# Patient Record
Sex: Male | Born: 1965 | Race: White | Hispanic: No | Marital: Single | Smoking: Former smoker
Health system: Southern US, Community
[De-identification: ages and names within clinical notes are randomized; demographics above are authoritative.]

## PROBLEM LIST (undated history)

## (undated) DIAGNOSIS — E785 Hyperlipidemia, unspecified: Secondary | ICD-10-CM

## (undated) DIAGNOSIS — I10 Essential (primary) hypertension: Secondary | ICD-10-CM

## (undated) HISTORY — PX: NECK SURGERY: SHX720

## (undated) HISTORY — DX: Hyperlipidemia, unspecified: E78.5

## (undated) HISTORY — DX: Essential (primary) hypertension: I10

---

## 2010-01-26 ENCOUNTER — Ambulatory Visit: Payer: Self-pay | Admitting: Diagnostic Radiology

## 2010-01-26 ENCOUNTER — Emergency Department (HOSPITAL_BASED_OUTPATIENT_CLINIC_OR_DEPARTMENT_OTHER): Admission: EM | Admit: 2010-01-26 | Discharge: 2010-01-27 | Payer: Self-pay | Admitting: Emergency Medicine

## 2010-08-26 LAB — URINALYSIS, ROUTINE W REFLEX MICROSCOPIC
Ketones, ur: NEGATIVE mg/dL
Leukocytes, UA: NEGATIVE
Protein, ur: NEGATIVE mg/dL
Specific Gravity, Urine: 1.02 (ref 1.005–1.030)
Urobilinogen, UA: 0.2 mg/dL (ref 0.0–1.0)

## 2010-08-26 LAB — CBC
MCH: 30.1 pg (ref 26.0–34.0)
MCHC: 34.2 g/dL (ref 30.0–36.0)
Platelets: 421 10*3/uL — ABNORMAL HIGH (ref 150–400)
RBC: 5.59 MIL/uL (ref 4.22–5.81)
RDW: 12.1 % (ref 11.5–15.5)
WBC: 14.4 10*3/uL — ABNORMAL HIGH (ref 4.0–10.5)

## 2010-08-26 LAB — POCT TOXICOLOGY PANEL: TCA Scrn: POSITIVE

## 2010-08-26 LAB — URINE MICROSCOPIC-ADD ON

## 2010-08-26 LAB — BASIC METABOLIC PANEL
CO2: 27 mEq/L (ref 19–32)
Chloride: 101 mEq/L (ref 96–112)
GFR calc Af Amer: >60 mL/min (ref >60)
GFR calc non Af Amer: >60 mL/min (ref >60)
Potassium: 4.2 mEq/L (ref 3.5–5.1)
Sodium: 143 mEq/L (ref 135–145)

## 2010-08-26 LAB — DIFFERENTIAL
Basophils Relative: 1 % (ref 0–1)
Monocytes Absolute: 1 10*3/uL (ref 0.1–1.0)
Monocytes Relative: 7 % (ref 3–12)
Neutrophils Relative %: 73 % (ref 43–77)

## 2016-07-27 ENCOUNTER — Ambulatory Visit (INDEPENDENT_AMBULATORY_CARE_PROVIDER_SITE_OTHER): Payer: 59 | Admitting: Neurology

## 2016-07-27 ENCOUNTER — Encounter: Payer: Self-pay | Admitting: Neurology

## 2016-07-27 VITALS — BP 124/72 | HR 75 | Ht 72.0 in | Wt 223.7 lb

## 2016-07-27 DIAGNOSIS — R32 Unspecified urinary incontinence: Secondary | ICD-10-CM | POA: Diagnosis not present

## 2016-07-27 DIAGNOSIS — R200 Anesthesia of skin: Secondary | ICD-10-CM | POA: Diagnosis not present

## 2016-07-27 DIAGNOSIS — R519 Headache, unspecified: Secondary | ICD-10-CM

## 2016-07-27 DIAGNOSIS — M542 Cervicalgia: Secondary | ICD-10-CM

## 2016-07-27 DIAGNOSIS — R51 Headache: Secondary | ICD-10-CM | POA: Diagnosis not present

## 2016-07-27 MED ORDER — TIZANIDINE HCL 2 MG PO TABS: 2.0000 mg | ORAL_TABLET | Freq: Four times a day (QID) | ORAL | 0 refills | Status: DC | PRN

## 2016-07-27 MED ORDER — TRAMADOL HCL 50 MG PO TABS: 50.0000 mg | ORAL_TABLET | Freq: Four times a day (QID) | ORAL | 1 refills | Status: DC | PRN

## 2016-07-27 MED ORDER — PREDNISONE 10 MG PO TABS: ORAL_TABLET | ORAL | 0 refills | Status: DC

## 2016-07-27 MED ORDER — GABAPENTIN 300 MG PO CAPS: 300.0000 mg | ORAL_CAPSULE | Freq: Two times a day (BID) | ORAL | 2 refills | Status: DC

## 2016-07-27 NOTE — Patient Instructions (Addendum)
1.  Stop butalbital and Aleve. 2.  To help break this daily headache, I will prescribe you a prednisone taper.  Take 6 tablets at once on day one, then 5 tablets on day two, then 4 tablets on day three, then 3 tablets on day four, then 2 tablets on day five, then 1 tablet on day six, then STOP.   3.  When you get a severe headache, take tramadol.  May take 1 pill up to every 6 hours as needed.  Limit use to no more than 2 days out of the week to prevent rebound headache.   4.  May take tizanidine for headache flare up as well (as directed).  Caution for drowsiness and driving if taken. 5.  We will check MRI of brain and cervical spine 6.  Start gabapentin 300mg  twice daily to help reduce frequency of headaches.  Contact me in 4 weeks and we can increase dose if needed. 7.  Follow up in 3 months but WE CAN INCREASE DOSE OF GABAPENTIN IN 4 WEEKS IF NEEDED, SO CONTACT ME THEN.

## 2016-07-27 NOTE — Progress Notes (Signed)
NEUROLOGY CONSULTATION NOTE  Cory Kerr MRN: [id number] DOB: December 25, 1965  Referring provider: Dr. Nehemiah Settle Primary care provider: Dr. Nehemiah Settle  Reason for consult:  headache  HISTORY OF PRESENT ILLNESS: Cory Kerr is a 51 year old male with hypertension, mixed hyperlipidemia, chronic back pain and history of renal calculus and tachycardia status post ablation in 2004 who presents for headache.  History supplemented by PCP note.  He has had headaches for several years, however they started to increase about 2 years ago and particularly gotten worse over the past 2 weeks.  He has a daily constant moderate 5-7/10 headache but has fluctuations of severe headaches.  They are mid-occipital and are non-radiating.  It is a 12/10 pounding pain, sometimes with tingling sensation on the back of his head.  There is some phonophobia but denies nausea, photophobia or visual disturbance.  They last several hours and occur about 5 days out of the month.  He notes some mild neck pain but does not radiate down the arms.  He had been taking ibuprofen daily for several years until last week, when his PCP told him to stop.  Since last week, he has been taking Aleve and Fioricet daily.  The Fioricet takes the edge off.  He denies any particular trigger.  They occur any time of day and he may wake up with them.  The other day, he was sitting at his desk at work when his left arm suddenly became numb for a few minutes and spontaneously resolved.  There was no pain or noticeable weakness.  It was not associated with headache.  He has history of increased urinary frequency, which requires him to wake up at night to go to the bathroom.  On two occasions, he woke up in bed with headache and had wet the bed. He denies back pain, saddle anesthesia, leg weakness or erectile dysfunction.  He drinks 1 cup of coffee daily He denies any depression or anxiety He denies family history of headache or intracranial aneurysm.  A  Head CT from 01/26/10 was personally reviewed and was unremarkable.  PAST MEDICAL HISTORY: Hypertension Hyperlipidemia  PAST SURGICAL HISTORY: No past surgical history on file.  MEDICATIONS: Lisinopril-HCTZ Claritin Fioricet Aleve  ALLERGIES: Allergies  Allergen Reactions  . Nitroglycerin Other (See Comments)    Sweating, wife stated he was "flopping like a fish". He stated he doesn't remember the incident at all.     FAMILY HISTORY: No family history on file. .  SOCIAL HISTORY: Social History   Social History  . Marital status: Single    Spouse name: N/A  . Number of children: N/A  . Years of education: N/A   Occupational History  . Not on file.   Social History Main Topics  . Smoking status: Former Smoker    Packs/day: 0.50    Years: 10.00    Types: Cigarettes    Quit date: 06/13/2003  . Smokeless tobacco: Current User  . Alcohol use Not on file  . Drug use: Unknown  . Sexual activity: Not on file   Other Topics Concern  . Not on file   Social History Narrative  . No narrative on file    REVIEW OF SYSTEMS: Constitutional: No fevers, chills, or sweats, no generalized fatigue, change in appetite Eyes: No visual changes, double vision, eye pain Ear, nose and throat: No hearing loss, ear pain, nasal congestion, sore throat Cardiovascular: No chest pain, palpitations Respiratory:  No shortness of breath at rest or with exertion,  wheezes GastrointestinaI: No nausea, vomiting, diarrhea, abdominal pain, fecal incontinence Genitourinary:  No dysuria, urinary retention or frequency Musculoskeletal:  No neck pain, back pain Integumentary: No rash, pruritus, skin lesions Neurological: as above Psychiatric: No depression, insomnia, anxiety Endocrine: No palpitations, fatigue, diaphoresis, mood swings, change in appetite, change in weight, increased thirst Hematologic/Lymphatic:  No purpura, petechiae. Allergic/Immunologic: no itchy/runny eyes, nasal congestion,  recent allergic reactions, rashes  PHYSICAL EXAM: Vitals:   07/27/16 1001  BP: 124/72  Pulse: 75   General: No acute distress.  Patient appears well-groomed.  Head:  Normocephalic/atraumatic Eyes:  fundi examined but not visualized Neck: supple, bilateral suboccipital tenderness, paraspinal tenderness at C7, full range of motion Back: No paraspinal tenderness Heart: regular rate and rhythm Lungs: Clear to auscultation bilaterally. Vascular: No carotid bruits. Neurological Exam: Mental status: alert and oriented to person, place, and time, recent and remote memory intact, fund of knowledge intact, attention and concentration intact, speech fluent and not dysarthric, language intact. Cranial nerves: CN I: not tested CN II: pupils equal, round and reactive to light, visual fields intact CN III, IV, VI:  full range of motion, no nystagmus, no ptosis CN V: facial sensation intact CN VII: upper and lower face symmetric CN VIII: hearing intact CN IX, X: gag intact, uvula midline CN XI: sternocleidomastoid and trapezius muscles intact CN XII: tongue midline Bulk & Tone: normal, no fasciculations. Motor:  5/5 throughout  Sensation:  Pinprick and vibration sensation intact. Deep Tendon Reflexes:  2+ throughout, although slightly more brisk in the knees, toes downgoing.  Finger to nose testing:  Without dysmetria.  Heel to shin:  Without dysmetria.  Gait:  Normal station and stride.  Able to turn and tandem walk. Romberg negative.  IMPRESSION: 1.  Occipital headaches 2.  Transient left arm numbness 3.  Urinary incontinence.  4.  Neck pain.  Headaches sound like cervicogenic headaches with possible component of occipital neuralgia.  He does have some neck pain and suboccipital tenderness.  However, further testing is necessary given his other symptoms.   PLAN: 1.  We will check MRI of brain and cervical spine to evaluate for cause of headache, transient left arm weakness and urinary  incontinence.  Further recommendations pending results.  2.  To help break this daily headache, I will prescribe  a prednisone taper.  3.  Start gabapentin 300mg  twice daily as preventative for headache. 4.  Stop Fioricet and Aleve.  5.  For abortive therapy, will prescribe tramadol and tizanidine, limited to no more than 2 days out of the week to prevent rebound headache. 6.  Follow up in 3 months but contact me in 4 weeks with update and we can adjust dose of gabapentin if needed. 7.  Discuss two episodes of urinary incontinence with Dr. Nehemiah SettlePolite.  Thank you for allowing me to take part in the care of this patient.  Shon MilletAdam Danisha Brassfield, DO  CC:  Renford Dillsonald Polite, MD

## 2016-08-03 ENCOUNTER — Telehealth: Payer: Self-pay | Admitting: Neurology

## 2016-08-03 NOTE — Telephone Encounter (Signed)
Called patient. Provided GI phone number.

## 2016-08-03 NOTE — Telephone Encounter (Signed)
Cory PandaWalter Kerr Aug 12, 2065. His # [phone] [id number]. He was calling regarding his MRI. He was needing to know if he will be contacted to set it up? Thank you

## 2016-08-09 ENCOUNTER — Ambulatory Visit
Admission: RE | Admit: 2016-08-09 | Discharge: 2016-08-09 | Disposition: A | Discharge status: Home / Self Care | Payer: 59 | Source: Ambulatory Visit | Attending: Neurology | Admitting: Neurology

## 2016-08-09 DIAGNOSIS — R51 Headache: Principal | ICD-10-CM

## 2016-08-09 DIAGNOSIS — M542 Cervicalgia: Secondary | ICD-10-CM

## 2016-08-09 DIAGNOSIS — R519 Headache, unspecified: Secondary | ICD-10-CM

## 2016-08-09 DIAGNOSIS — R32 Unspecified urinary incontinence: Secondary | ICD-10-CM

## 2016-08-09 DIAGNOSIS — R200 Anesthesia of skin: Secondary | ICD-10-CM

## 2016-08-10 ENCOUNTER — Telehealth: Payer: Self-pay | Admitting: Neurology

## 2016-08-10 NOTE — Telephone Encounter (Signed)
Patient made aware of results and would like referral to Neurosurgery.  Referral faxed to [facility] at [phone] with confirmation received. They will contact the patient to schedule.

## 2016-08-10 NOTE — Telephone Encounter (Signed)
-----   Message from Drema DallasAdam R Jaffe, DO sent at 08/10/2016 12:19 PM EST ----- MRI of brain looks okay.  MRI of cervical spine shows disc bulge that is slightly pressing on the spinal cord.  I can't say that this is causing the urinary incontinence but it definitely is contributing to the headache.  If he is open, we can send him to neurosurgery for consultation to see if he may benefit from surgery.

## 2016-09-22 ENCOUNTER — Ambulatory Visit: Payer: Self-pay | Admitting: Neurology

## 2016-10-26 ENCOUNTER — Ambulatory Visit: Payer: 59 | Admitting: Neurology

## 2016-10-26 DIAGNOSIS — Z029 Encounter for administrative examinations, unspecified: Secondary | ICD-10-CM

## 2016-10-27 ENCOUNTER — Encounter: Payer: Self-pay | Admitting: Neurology

## 2016-12-08 ENCOUNTER — Encounter: Payer: Self-pay | Admitting: Neurology

## 2016-12-08 ENCOUNTER — Ambulatory Visit (INDEPENDENT_AMBULATORY_CARE_PROVIDER_SITE_OTHER): Payer: 59 | Admitting: Neurology

## 2016-12-08 VITALS — BP 118/82 | HR 79 | Ht 72.0 in | Wt 225.4 lb

## 2016-12-08 DIAGNOSIS — R51 Headache: Secondary | ICD-10-CM

## 2016-12-08 DIAGNOSIS — R519 Headache, unspecified: Secondary | ICD-10-CM

## 2016-12-08 DIAGNOSIS — M542 Cervicalgia: Secondary | ICD-10-CM

## 2016-12-08 MED ORDER — GABAPENTIN 300 MG PO CAPS: ORAL_CAPSULE | ORAL | 0 refills | Status: DC

## 2016-12-08 MED ORDER — TIZANIDINE HCL 2 MG PO TABS: 2.0000 mg | ORAL_TABLET | Freq: Three times a day (TID) | ORAL | 0 refills | Status: DC

## 2016-12-08 MED ORDER — PREDNISONE 10 MG PO TABS: ORAL_TABLET | ORAL | 0 refills | Status: DC

## 2016-12-08 NOTE — Patient Instructions (Signed)
1.  Restart a prednisone taper.  Take 6tabs x1day, then 5tabs x1day, then 4tabs x1day, then 3tabs x1day, then 2tabs x1day, then 1tab x1day, then STOP 2.  Restart gabapentin 300mg , 1 capsule twice daily, in 7 days, increase to 2 capsules twice daily. 3.  Restart tizanidine 2mg .  Take three times daily if tolerated. 4.  Contact me in 3 weeks with update. 5.  Follow up

## 2016-12-08 NOTE — Progress Notes (Signed)
NEUROLOGY FOLLOW UP OFFICE NOTE  Cory Kerr [id number]  HISTORY OF PRESENT ILLNESS: Cory Kerr is a 51 year old male with hypertension, mixed hyperlipidemia, chronic back pain and history of renal calculus and tachycardia status post ablation in 2004 who follows up for occipital headaches.  UPDATE: MRI of brain from 08/09/16 was personally reviewed and revealed a partial empty sella but otherwise unremarkable.  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  Due to metallic foreign body in patient's chin, there is partial obscuration of the mid cervical spine.  He had noted some mild left hand weakness.  He was referred to neurosurgery for evaluation.  He underwent ACDF in March and the headaches had improved for a while until a week and a half ago.  He reports a constant intractable headache, fluctuating from 6 to 10/10.  He denies any particular trigger.  He started working out again a month ago and denies heavy lifting.  Nothing makes it better.  Ibuprofen is ineffective.  It is mid-suboccipital/upper cervical region.  Repeat cervical scan reportedly revealed nothing structural that would be causing the headache.  Prior to surgery, he took gabapentin 300mg  twice daily for about a month.  It was ineffective.  Tizanidine 2mg  and tramadol as needed were ineffective.   HISTORY: He has had headaches for several years, however they started to increase in 2016 and particularly gotten worse in early 2018.  At the time, he described a daily constant moderate 5-7/10 headache but has fluctuations of severe headaches.  They are mid-occipital and are non-radiating.  It is a 12/10 pounding pain, sometimes with tingling sensation on the back of his head.  There is some phonophobia but denies nausea, photophobia or visual disturbance.  They last several hours and occur about 5 days out  of the month.  He notes some mild neck pain but does not radiate down the arms.  He had been taking ibuprofen daily for several years until last week, when his PCP told him to stop.  Since last week, he has been taking Aleve and Fioricet daily.  The Fioricet takes the edge off.  He denies any particular trigger.  They occur any time of day and he may wake up with them.  The other day, he was sitting at his desk at work when his left arm suddenly became numb for a few minutes and spontaneously resolved.  There was no pain or noticeable weakness.  It was not associated with headache.  He has history of increased urinary frequency, which requires him to wake up at night to go to the bathroom.  On two occasions, he woke up in bed with headache and had wet the bed. He denies back pain, saddle anesthesia, leg weakness or erectile dysfunction.   He drinks 1 cup of coffee daily He denies any depression or anxiety He denies family history of headache or intracranial aneurysm.  PAST MEDICAL HISTORY: Past Medical History:  Diagnosis Date  . Hyperlipemia   . Hypertension     MEDICATIONS: No current outpatient prescriptions on file prior to visit.   No current facility-administered medications on file prior to visit.     ALLERGIES: Allergies  Allergen Reactions  . Nitroglycerin Other (See Comments)    Sweating, wife stated he was "flopping like a fish". He stated he doesn't remember the incident at all.     FAMILY HISTORY: Family  History  Problem Relation Age of Onset  . Heart disease Mother   . Heart disease Father     SOCIAL HISTORY: Social History   Social History  . Marital status: Single    Spouse name: N/A  . Number of children: N/A  . Years of education: N/A   Occupational History  . Not on file.   Social History Main Topics  . Smoking status: Former Smoker    Packs/day: 0.50    Years: 10.00    Types: Cigarettes    Quit date: 06/13/2003  . Smokeless tobacco: Current User    . Alcohol use Not on file  . Drug use: Unknown  . Sexual activity: Not on file   Other Topics Concern  . Not on file   Social History Narrative  . No narrative on file    REVIEW OF SYSTEMS: Constitutional: No fevers, chills, or sweats, no generalized fatigue, change in appetite Eyes: No visual changes, double vision, eye pain Ear, nose and throat: No hearing loss, ear pain, nasal congestion, sore throat Cardiovascular: No chest pain, palpitations Respiratory:  No shortness of breath at rest or with exertion, wheezes GastrointestinaI: No nausea, vomiting, diarrhea, abdominal pain, fecal incontinence Genitourinary:  No dysuria, urinary retention or frequency Musculoskeletal:  Neck pain Integumentary: No rash, pruritus, skin lesions Neurological: as above Psychiatric: No depression, insomnia, anxiety Endocrine: No palpitations, fatigue, diaphoresis, mood swings, change in appetite, change in weight, increased thirst Hematologic/Lymphatic:  No purpura, petechiae. Allergic/Immunologic: no itchy/runny eyes, nasal congestion, recent allergic reactions, rashes  PHYSICAL EXAM: Vitals:   12/08/16 0856  BP: 118/82  Pulse: 79   General: No acute distress.  Patient appears well-groomed.  normal body habitus. Head:  Normocephalic/atraumatic Eyes:  Fundi examined but not visualized Neck: supple, left/mid suboccipital upper cervical paraspinal tenderness, full range of motion Heart:  Regular rate and rhythm Lungs:  Clear to auscultation bilaterally Back: No paraspinal tenderness Neurological Exam: alert and oriented to person, place, and time. Attention span and concentration intact, recent and remote memory intact, fund of knowledge intact.  Speech fluent and not dysarthric, language intact.  CN II-XII intact. Bulk and tone normal, muscle strength 5/5 throughout.  Sensation to light touch, temperature and vibration intact.  Deep tendon reflexes 2+ throughout, toes downgoing.  Finger to  nose and heel to shin testing intact.  Gait normal, Romberg negative.  IMPRESSION: Suboccipital/upper cervical neck pain, intractable  PLAN: 1.  Will prescribe prednisone taper 2.  Restart gabapentin titrating higher to 600mg  twice daily 3.  Restart tizanidine 2mg , but will have him take three times daily around the clock (or as tolerated) 4.  He is to contact me in 2-3 weeks  Shon MilletAdam Victorious Kundinger, DO  CC:  Renford Dillsonald Polite, MD

## 2016-12-26 ENCOUNTER — Telehealth: Payer: Self-pay | Admitting: Neurology

## 2016-12-26 NOTE — Telephone Encounter (Signed)
Caller: Cory Kerr   Urgent? No  Reason for the call: He has been taking a muscle relaxer as well as a pain killer. He said he is still having headaches and they are lasting for days. He said he feels like a "Zombie" with the medication, but that it does take the edge off some and helps him sleep. Please call. Thanks

## 2016-12-27 ENCOUNTER — Other Ambulatory Visit: Payer: Self-pay | Admitting: *Deleted

## 2016-12-27 MED ORDER — NORTRIPTYLINE HCL 25 MG PO CAPS: 25.0000 mg | ORAL_CAPSULE | Freq: Every day | ORAL | 3 refills | Status: DC

## 2016-12-27 NOTE — Telephone Encounter (Signed)
Left message for patient to call me back. 

## 2016-12-27 NOTE — Telephone Encounter (Signed)
It looks like he is taking gabapentin and tizanidine.  Please advise.

## 2016-12-27 NOTE — Telephone Encounter (Signed)
1.  I don't want to increase the gabapentin if it is already making him drowsy.  I would have him taper off of it (300mg  twice daily for 1 week and then stop). 2.  Instead, we can start nortriptyline 25mg  at bedtime. 3.  He should continue tizanidine 2mg .  He should continue taking it 3 times daily but if he feels this may be causing drowsiness, then he can take it up to three times daily as needed. 4.  Contact us in 4 weeks with update.

## 2016-12-27 NOTE — Telephone Encounter (Signed)
Patient given instructions and agreed with plan.  Rx sent in for nortriptyline.

## 2017-01-22 ENCOUNTER — Telehealth: Payer: Self-pay | Admitting: Neurology

## 2017-01-22 NOTE — Telephone Encounter (Signed)
Recommend increasing nortriptyline to 50mg  at bedtime and see if this helps, as he is on a low dose of this.

## 2017-01-22 NOTE — Telephone Encounter (Signed)
Please advise if another medication would be appropriate.

## 2017-01-22 NOTE — Telephone Encounter (Signed)
Patient called needing to see if he can have something stronger for his headaches. He said the medications that Dr. Everlena CooperJaffe put him on are not touching the headaches. Please Advise. Thanks

## 2017-01-23 MED ORDER — FLURBIPROFEN 50 MG PO TABS: 50.0000 mg | ORAL_TABLET | Freq: Two times a day (BID) | ORAL | 0 refills | Status: DC | PRN

## 2017-01-23 MED ORDER — TIZANIDINE HCL 2 MG PO TABS
2.0000 mg | ORAL_TABLET | Freq: Three times a day (TID) | ORAL | 0 refills | Status: DC
Start: 2017-01-23 — End: 2017-03-11

## 2017-01-23 NOTE — Telephone Encounter (Signed)
For his acute pain, start Ansaid 50mg  twice daily as needed (rx sent).  Continue nortriptyline 50mg  at bedtime and tizanidine 2mg  TID.

## 2017-01-23 NOTE — Telephone Encounter (Signed)
PT called and said the medication is not helping for the headaches and is asking if someone can call him in a prescription for something to help him, he says the headaches are so bad they bring him to his knees

## 2017-01-23 NOTE — Telephone Encounter (Signed)
Spoke w/Pt. advsd to increase nortriptyline from 25 mg to 50 mg. Pt states nothing is helping, he can't take the pain of these headaches, asks that we please call in something that will stop the pain. Asked Pt if still taking Tizanidine 2 mg TID, he stated he is but will run out tomorrow or next day, rqsts refill to [facility] [place].Sent Rx. Pt also needs f/u, will advise again on return call.

## 2017-01-23 NOTE — Telephone Encounter (Signed)
Advsd Pt. Of Ansaid Rx sent in, also to continue nortiptyline at 50 mg QHS and Tizanidine 2 mg TID. Pt will call back to make f/u appt.

## 2017-01-24 ENCOUNTER — Other Ambulatory Visit: Payer: Self-pay

## 2017-01-24 ENCOUNTER — Telehealth: Payer: Self-pay

## 2017-01-24 NOTE — Telephone Encounter (Signed)
Pt called office, said he want to p/u Rxs I told him I would send in, there was only tizanidine. I assured him I also sent Ansaid. Called pharmacy, spoke w/pharmacist Tammy SoursGreg. He said both Rxs were rcvd yesterday, Ansaid had to be ordered, but is ready for P/u. Called Pt and advsd.

## 2017-02-15 ENCOUNTER — Other Ambulatory Visit: Payer: Self-pay | Admitting: Neurology

## 2017-02-15 NOTE — Telephone Encounter (Signed)
He got #[phone] filled 3 weeks ago.  Please advise.

## 2017-02-15 NOTE — Telephone Encounter (Signed)
Dr. Everlena CooperJaffe patient, will route to him.

## 2017-03-11 ENCOUNTER — Other Ambulatory Visit: Payer: Self-pay | Admitting: Neurology

## 2017-03-14 ENCOUNTER — Encounter: Payer: Self-pay | Admitting: Neurology

## 2017-03-14 ENCOUNTER — Ambulatory Visit (INDEPENDENT_AMBULATORY_CARE_PROVIDER_SITE_OTHER): Payer: 59 | Admitting: Neurology

## 2017-03-14 VITALS — BP 104/82 | HR 78 | Ht 72.0 in | Wt 217.6 lb

## 2017-03-14 DIAGNOSIS — M5481 Occipital neuralgia: Secondary | ICD-10-CM | POA: Diagnosis not present

## 2017-03-14 DIAGNOSIS — G4486 Cervicogenic headache: Secondary | ICD-10-CM

## 2017-03-14 DIAGNOSIS — R51 Headache: Secondary | ICD-10-CM | POA: Diagnosis not present

## 2017-03-14 DIAGNOSIS — R519 Headache, unspecified: Secondary | ICD-10-CM

## 2017-03-14 MED ORDER — NORTRIPTYLINE HCL 50 MG PO CAPS: 100.0000 mg | ORAL_CAPSULE | Freq: Every day | ORAL | 2 refills | Status: DC

## 2017-03-14 MED ORDER — TIZANIDINE HCL 4 MG PO TABS: 4.0000 mg | ORAL_TABLET | Freq: Three times a day (TID) | ORAL | 2 refills | Status: DC

## 2017-03-14 MED ORDER — NAPROXEN 500 MG PO TABS: 500.0000 mg | ORAL_TABLET | Freq: Two times a day (BID) | ORAL | 2 refills | Status: DC | PRN

## 2017-03-14 NOTE — Progress Notes (Signed)
NEUROLOGY FOLLOW UP OFFICE NOTE  Cory Kerr [id number]  HISTORY OF PRESENT ILLNESS: Cory Kerr is a 51 year old male with hypertension, mixed hyperlipidemia, chronic back pain and history of renal calculus and tachycardia status post ablation in 2004 who follows up for occipital headaches.   UPDATE: Prednisone taper did not abort the persistent headache but helped for a couple of days.  He was restarted on gabapentin and tizanidine  three times daily.  Gabapentin caused drowsiness, so it was changed to nortriptyline.  He is currently taking nortriptyline  at bedtime and tizanidine  three times daily.  Headaches are still severe.  They last 3 days and occur every 2 to 3 days.  No specific triggers.  Nothing relieves it.   Understandably, he is very frustrated.   HISTORY: He has had headaches for several years, however they started to increase in 2016 and particularly gotten worse in early 2018.  At the time, he described a daily constant moderate 5-7/10 headache but has fluctuations of severe headaches.  They are mid-occipital and are non-radiating.  It is a 12/10 pounding pain, sometimes with tingling sensation on the back of his head.  There is some phonophobia but denies nausea, photophobia or visual disturbance.  They last several hours and occur about 5 days out of the month.  He notes some mild neck pain but does not radiate down the arms.  He had been taking ibuprofen daily for several years until last week, when his PCP told him to stop.  Since last week, he has been taking Aleve and Fioricet daily.  The Fioricet takes the edge off.  He denies any particular trigger.  They occur any time of day and he may wake up with them.  The other day, he was sitting at his desk at work when his left arm suddenly became numb for a few minutes and spontaneously resolved.  There was no pain or noticeable weakness.  It was not associated with headache.  He has history of increased urinary  frequency, which requires him to wake up at night to go to the bathroom.  On two occasions, he woke up in bed with headache and had wet the bed. He denies back pain, saddle anesthesia, leg weakness or erectile dysfunction.  MRI of brain from 08/09/16 was personally reviewed and revealed a partial empty sella but otherwise unremarkable.  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  Due to metallic foreign body in patient's chin, there is partial obscuration of the mid cervical spine.  He had noted some mild left hand weakness.  He was referred to neurosurgery for evaluation.  He underwent ACDF in March  2018 and the headaches had improved for a while.  Tramadol was ineffective.   He drinks 1 cup of coffee daily He denies any depression or anxiety He denies family history of headache or intracranial aneurysm.  PAST MEDICAL HISTORY: Past Medical History:  Diagnosis Date  . Hyperlipemia   . Hypertension     MEDICATIONS: Current Outpatient Prescriptions on File Prior to Visit  Medication Sig Dispense Refill  . flurbiprofen (ANSAID) 50 MG tablet TAKE 1 TABLET BY MOUTH TWICE DAILY IF NEEDED 20 tablet 0  . gabapentin (NEURONTIN) 300 MG capsule Take 1 capsule twice daily for 7 days, then 2 capsules twice daily (Patient not taking: Reported on 03/14/2017) 120 capsule 0   No current  facility-administered medications on file prior to visit.     ALLERGIES: Allergies  Allergen Reactions  . Nitroglycerin Other (See Comments)    Sweating, wife stated he was "flopping like a fish". He stated he doesn't remember the incident at all.     FAMILY HISTORY: Family History  Problem Relation Age of Onset  . Heart disease Mother   . Heart disease Father     SOCIAL HISTORY: Social History   Social History  . Marital status: Single    Spouse name: N/A  . Number of children: N/A  .  Years of education: N/A   Occupational History  . Not on file.   Social History Main Topics  . Smoking status: Former Smoker    Packs/day: 0.50    Years: 10.00    Types: Cigarettes    Quit date: 06/13/2003  . Smokeless tobacco: Current User  . Alcohol use Not on file  . Drug use: Unknown  . Sexual activity: Not on file   Other Topics Concern  . Not on file   Social History Narrative  . No narrative on file    REVIEW OF SYSTEMS: Constitutional: No fevers, chills, or sweats, no generalized fatigue, change in appetite Eyes: No visual changes, double vision, eye pain Ear, nose and throat: No hearing loss, ear pain, nasal congestion, sore throat Cardiovascular: No chest pain, palpitations Respiratory:  No shortness of breath at rest or with exertion, wheezes GastrointestinaI: No nausea, vomiting, diarrhea, abdominal pain, fecal incontinence Genitourinary:  No dysuria, urinary retention or frequency Musculoskeletal:  No neck pain, back pain Integumentary: No rash, pruritus, skin lesions Neurological: as above Psychiatric: No depression, insomnia, anxiety Endocrine: No palpitations, fatigue, diaphoresis, mood swings, change in appetite, change in weight, increased thirst Hematologic/Lymphatic:  No purpura, petechiae. Allergic/Immunologic: no itchy/runny eyes, nasal congestion, recent allergic reactions, rashes  PHYSICAL EXAM: Vitals:   03/14/17 0941  BP: 104/82  Pulse: 78  SpO2: 97%   General: No acute distress.  Patient appears well-groomed.  Head:  Normocephalic/atraumatic, bilateral suboccipital pain to palpation bilaterally Eyes:  Fundi examined but not visualized Neck: supple, some mild  paraspinal tenderness, full range of motion Heart:  Regular rate and rhythm Lungs:  Clear to auscultation bilaterally Back: No paraspinal tenderness Neurological Exam: alert and oriented to person, place, and time. Attention span and concentration intact, recent and remote memory  intact, fund of knowledge intact.  Speech fluent and not dysarthric, language intact.  CN II-XII intact. Bulk and tone normal, muscle strength 5/5 throughout.  Sensation to light touch  intact.  Deep tendon reflexes 2+ throughout.  Finger to nose testing intact.  Gait normal  IMPRESSION: Occipital headache likely cervicogenic, also may be occipital neuralgia  PLAN: 1.  Increase nortriptyline to  at bedtime 2.  Increase tizanidine to  three times daily 3.  Take naproxen  every 12 hours as needed for acute headache (limited to no more than 2 days out of the week 4.  Contact me in 6 weeks.  If not better, then we will try occipital nerve blocks  5.  Follow up in 3 months.  Shon Millet, DO  CC:  Renford Dills, MD

## 2017-03-14 NOTE — Patient Instructions (Signed)
1.  Increase nortriptyline to  at bedtime 2.  Increase tizanidine to  three times daily 3.  Take naproxen  every 12 hours as needed for acute headache (limited to no more than 2 days out of the week 4.  Contact me in 6 weeks.  If not better, then I will have you come in for steroid injections in the back of the head 5.  Follow up in 3 months.

## 2017-03-23 ENCOUNTER — Other Ambulatory Visit: Payer: Self-pay | Admitting: Neurology

## 2017-03-29 ENCOUNTER — Other Ambulatory Visit: Payer: Self-pay | Admitting: Neurology

## 2017-03-29 ENCOUNTER — Telehealth: Payer: Self-pay | Admitting: Neurology

## 2017-03-29 NOTE — Telephone Encounter (Signed)
Spoke with Pt, he wanted gabapentin refilled, advsd him was refilled today at 1:04pm for #120 with 3 refills

## 2017-03-29 NOTE — Telephone Encounter (Signed)
Pt left a voicemail message asking for a call back from Dr Moises BloodJaffe's nurse but did not say why

## 2017-04-27 ENCOUNTER — Telehealth: Payer: Self-pay | Admitting: Neurology

## 2017-04-27 NOTE — Telephone Encounter (Signed)
Patient called and said that he was updating Dr. Everlena CooperJaffe on his medication. He said since the dosage had been increased he has not had a headache. He said it seems to be working. Thanks

## 2017-04-27 NOTE — Telephone Encounter (Signed)
FYI

## 2017-06-20 ENCOUNTER — Ambulatory Visit: Payer: 59 | Admitting: Neurology

## 2017-06-20 ENCOUNTER — Other Ambulatory Visit: Payer: Self-pay | Admitting: Neurology

## 2017-06-20 ENCOUNTER — Encounter: Payer: Self-pay | Admitting: Neurology

## 2017-06-20 VITALS — BP 136/100 | HR 88 | Ht 72.0 in | Wt 224.6 lb

## 2017-06-20 DIAGNOSIS — R519 Headache, unspecified: Secondary | ICD-10-CM

## 2017-06-20 DIAGNOSIS — I10 Essential (primary) hypertension: Secondary | ICD-10-CM

## 2017-06-20 DIAGNOSIS — R51 Headache: Secondary | ICD-10-CM | POA: Diagnosis not present

## 2017-06-20 NOTE — Progress Notes (Signed)
NEUROLOGY FOLLOW UP OFFICE NOTE  Cory Kerr [id number]  HISTORY OF PRESENT ILLNESS: Cory Kerr is a [age] year old male with hypertension, mixed hyperlipidemia, chronic back pain and history of renal calculus and tachycardia status post ablation in 2004 who follows up for occipital headaches.   UPDATE: Last visit, we increased tizanidine and nortriptyline.  He is back on gabapentin.  He is doing much better. Intensity:  1 severe headache a week, 2 dull headaches a week Duration:  Severe headache lasts 3 to 4 hours.  Dull headaches linger for about half a day Frequency:  1 severe headache, 2 dull headaches a week. Current NSAIDS:  naproxen 500mg  Current analgesics:  no Current triptans:  no Current anti-emetic:  no Current muscle relaxants:  tizanidine 4mg  three times daily Current anti-anxiolytic:  no Current sleep aide:  no Current Antihypertensive medications:  Prinzide Current Antidepressant medications:  nortriptyline 100mg  Current Anticonvulsant medications:  gabapentin 600mg  twice daily Current Vitamins/Herbal/Supplements:  no Current Antihistamines/Decongestants:  no Other therapy:  no   HISTORY: He has had headaches for several years, however they started to increase in 2016 and particularly gotten worse in early 2018.  At the time, he described a daily constant moderate 5-7/10 headache but has fluctuations of severe headaches.  They are mid-occipital and are non-radiating.  It is a 12/10 pounding pain, sometimes with tingling sensation on the back of his head.  There is some phonophobia but denies nausea, photophobia or visual disturbance.  They last several hours and occur about 5 days out of the month.  He notes some mild neck pain but does not radiate down the arms.  He had been taking ibuprofen daily for several years until last week, when his PCP told him to stop.  Since last week, he has been taking Aleve and Fioricet daily.  The Fioricet takes the edge off.  He  denies any particular trigger.  They occur any time of day and he may wake up with them.  The other day, he was sitting at his desk at work when his left arm suddenly became numb for a few minutes and spontaneously resolved.  There was no pain or noticeable weakness.  It was not associated with headache.  He has history of increased urinary frequency, which requires him to wake up at night to go to the bathroom.  On two occasions, he woke up in bed with headache and had wet the bed. He denies back pain, saddle anesthesia, leg weakness or erectile dysfunction.  MRI of brain from 08/09/16 was personally reviewed and revealed a partial empty sella but otherwise unremarkable.  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  Due to metallic foreign body in patient's chin, there is partial obscuration of the mid cervical spine.  He had noted some mild left hand weakness.  He was referred to neurosurgery for evaluation.  He underwent ACDF in March  2018 and the headaches had improved for a while.  Tramadol was ineffective.  Past NSAIDS:  ibuprofen Past analgesics:  Fioricet Past abortive triptans:  no Past muscle relaxants:  no Past antidepressant medications:  no Past anticonvulsant medications:  no Past vitamins/Herbal/Supplements:  no Other past therapies:  Status post ACDF   He drinks 1 cup of coffee daily He denies any depression or anxiety He denies family history of headache or intracranial aneurysm.  PAST MEDICAL HISTORY: Past Medical  History:  Diagnosis Date  . Hyperlipemia   . Hypertension     MEDICATIONS: Current Outpatient Medications on File Prior to Visit  Medication Sig Dispense Refill  . lovastatin (MEVACOR) 20 MG tablet Take 1 tablet by mouth daily.    . flurbiprofen (ANSAID) 50 MG tablet TAKE 1 TABLET BY MOUTH TWICE DAILY IF NEEDED 20 tablet 0  . gabapentin  (NEURONTIN) 300 MG capsule take 1 capsule by mouth twice a day for 7 days then take 2 capsules twice a day 120 capsule 3  . lisinopril-hydrochlorothiazide (PRINZIDE,ZESTORETIC) 10-12.5 MG tablet Take 1 tablet by mouth daily.  0  . loratadine (CLARITIN) 10 MG tablet Take 10 mg by mouth daily as needed for allergies.     No current facility-administered medications on file prior to visit.     ALLERGIES: Allergies  Allergen Reactions  . Nitroglycerin Other (See Comments)    Sweating, wife stated he was "flopping like a fish". He stated he doesn't remember the incident at all.     FAMILY HISTORY: Family History  Problem Relation Age of Onset  . Heart disease Mother   . Heart disease Father     SOCIAL HISTORY: Social History   Socioeconomic History  . Marital status: Single    Spouse name: Not on file  . Number of children: Not on file  . Years of education: Not on file  . Highest education level: Not on file  Social Needs  . Financial resource strain: Not on file  . Food insecurity - worry: Not on file  . Food insecurity - inability: Not on file  . Transportation needs - medical: Not on file  . Transportation needs - non-medical: Not on file  Occupational History  . Not on file  Tobacco Use  . Smoking status: Former Smoker    Packs/day: 0.50    Years: 10.00    Pack years: 5.00    Types: Cigarettes    Last attempt to quit: 06/13/2003    Years since quitting: 14.0  . Smokeless tobacco: Current User  Substance and Sexual Activity  . Alcohol use: Not on file  . Drug use: Not on file  . Sexual activity: Not on file  Other Topics Concern  . Not on file  Social History Narrative  . Not on file    REVIEW OF SYSTEMS: Constitutional: No fevers, chills, or sweats, no generalized fatigue, change in appetite Eyes: No visual changes, double vision, eye pain Ear, nose and throat: No hearing loss, ear pain, nasal congestion, sore throat Cardiovascular: No chest pain,  palpitations Respiratory:  No shortness of breath at rest or with exertion, wheezes GastrointestinaI: No nausea, vomiting, diarrhea, abdominal pain, fecal incontinence Genitourinary:  No dysuria, urinary retention or frequency Musculoskeletal:  No neck pain, back pain Integumentary: No rash, pruritus, skin lesions Neurological: as above Psychiatric: No depression, insomnia, anxiety Endocrine: No palpitations, fatigue, diaphoresis, mood swings, change in appetite, change in weight, increased thirst Hematologic/Lymphatic:  No purpura, petechiae. Allergic/Immunologic: no itchy/runny eyes, nasal congestion, recent allergic reactions, rashes  PHYSICAL EXAM: Vitals:   06/20/17 1132  BP: (!) 136/100  Pulse: 88  SpO2: 96%   General: No acute distress.  Patient appears well-groomed.   Head:  Normocephalic/atraumatic Eyes:  Fundi examined but not visualized Neck: supple, no paraspinal tenderness, full range of motion Heart:  Regular rate and rhythm Lungs:  Clear to auscultation bilaterally Back: No paraspinal tenderness Neurological Exam: alert and oriented to person, place, and time. Attention span and  concentration intact, recent and remote memory intact, fund of knowledge intact.  Speech fluent and not dysarthric, language intact.  CN II-XII intact. Bulk and tone normal, muscle strength 5/5 throughout.  Sensation to light touch, temperature and vibration intact.  Deep tendon reflexes 2+ throughout, toes downgoing.  Finger to nose and heel to shin testing intact.  Gait normal, Romberg negative.  IMPRESSION: Occipital headaches, cervicogenic versus severe tension type, improved HTN  PLAN: 1.  Continue tizanidine 4mg  three times daily, gabapentin 600mg  twice daily and nortriptyline 100mg  at bedtime. 2.  I would like to abort his headaches sooner.  I will see if he will be able to try the ketorolac nasal spray Sprix. 3.  Follow up in 3 months. 4.  Follow up with PCP regarding blood  pressure  Shon Millet, DO  CC: Renford Dills, MD

## 2017-06-20 NOTE — Patient Instructions (Addendum)
1.  Instead of naproxen, I would like to try you on this NSAID nasal spray (Sprix), 1 spray in one nostril.  May repeat every 6 to 8 hours, maximum of 4 sprays in 24 hours.  This may be a better medication because it will get into your system faster.  I am speaking with the rep tomorrow and we will contact you then. 2.  Continue tizanidine 4mg  three times daily, gabapentin 600mg  twice daily and nortriptyline 100mg  at bedtime 3.  Follow up in 3 months.

## 2017-08-06 ENCOUNTER — Telehealth: Payer: Self-pay | Admitting: Neurology

## 2017-08-06 NOTE — Telephone Encounter (Signed)
[facility] [facility]

## 2017-09-19 ENCOUNTER — Ambulatory Visit: Payer: 59 | Admitting: Neurology

## 2017-09-21 ENCOUNTER — Other Ambulatory Visit: Payer: Self-pay | Admitting: Neurology

## 2018-04-09 ENCOUNTER — Other Ambulatory Visit: Payer: Self-pay | Admitting: Neurology

## 2019-01-01 IMAGING — MR MR HEAD W/O CM
10 series · 48 of 48 positions shown · non-contrast
Comparison: CT head 01/26/2010.

CLINICAL DATA: Occipital headache. Neck pain. LEFT arm numbness.
Urinary incontinence.

EXAM:
MRI HEAD WITHOUT CONTRAST
MRI CERVICAL SPINE WITHOUT CONTRAST
TECHNIQUE: Multiplanar, multiecho pulse sequences of the brain and surrounding
structures, and cervical spine, to include the craniocervical
junction and cervicothoracic junction, were obtained without
intravenous contrast.
The patient has a BB in the soft tissues of the anterior neck. This
did not significantly affect image quality with the brain MR. Some
images of the cervical spine MRI are degraded.

[Series 2: t1_se_sag · sagittal · 5.0mm · 0.45mm/px · 1 of 21 slices shown]
[im 1/21]
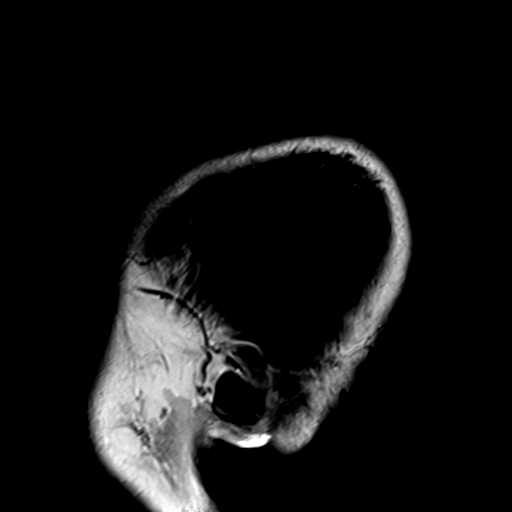

[Series 3: ep2d_diff_(id)_trace · axial · 3.0mm · 1.80mm/px · z∈[-58,+90]mm · 10 of 94 slices shown]
[im 1/94]
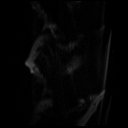
[im 11/94]
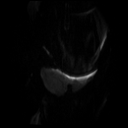
[im 21/94]
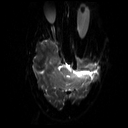
[im 32/94]
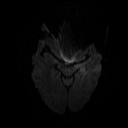
[im 42/94]
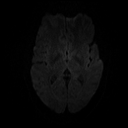
[im 52/94]
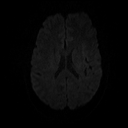
[im 63/94]
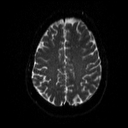
[im 73/94]
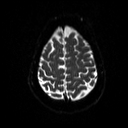
[im 83/94]
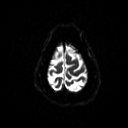
[im 94/94]
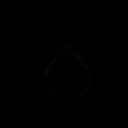

[Series 4: ep2d_diff_(id)_trace_adc · axial · 3.0mm · 1.80mm/px · z∈[-58,+90]mm · 5 of 50 slices shown]
[im 1/50]
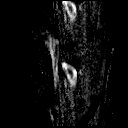
[im 13/50]
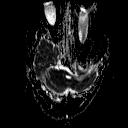
[im 25/50]
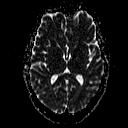
[im 37/50]
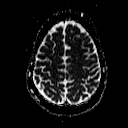
[im 50/50]
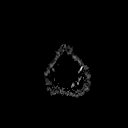

[Series 5: ep2d_diff_cor · coronal · 5.0mm · 1.77mm/px · 6 of 53 slices shown]
[im 1/53]
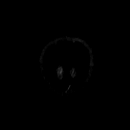
[im 11/53]
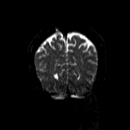
[im 21/53]
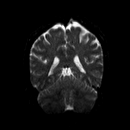
[im 32/53]
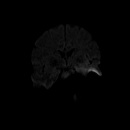
[im 42/53]
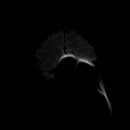
[im 53/53]
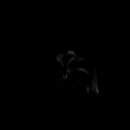

[Series 6: ep2d_diff_cor_adc · coronal · 5.0mm · 1.77mm/px · 3 of 27 slices shown]
[im 1/27]
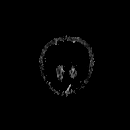
[im 14/27]
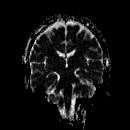
[im 27/27]
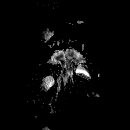

[Series 8: swi_images · axial · 2.0mm · 0.90mm/px · z∈[-64,+92]mm · 9 of 80 slices shown]
[im 1/80]
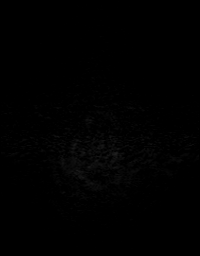
[im 10/80]
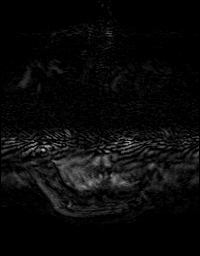
[im 20/80]
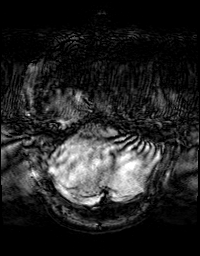
[im 30/80]
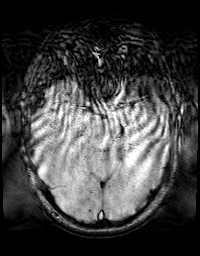
[im 40/80]
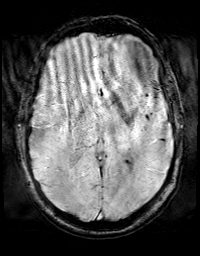
[im 50/80]
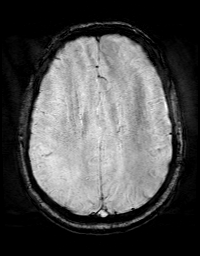
[im 60/80]
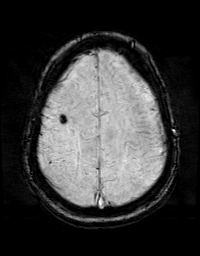
[im 70/80]
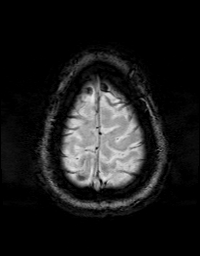
[im 80/80]
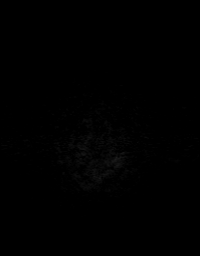

[Series 9: FLAIR · axial · 3.0mm · 0.43mm/px · z∈[-50,+81]mm · 5 of 45 slices shown]
[im 1/45]
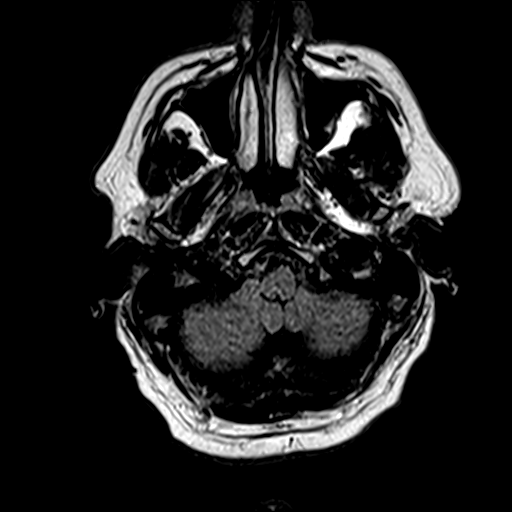
[im 12/45]
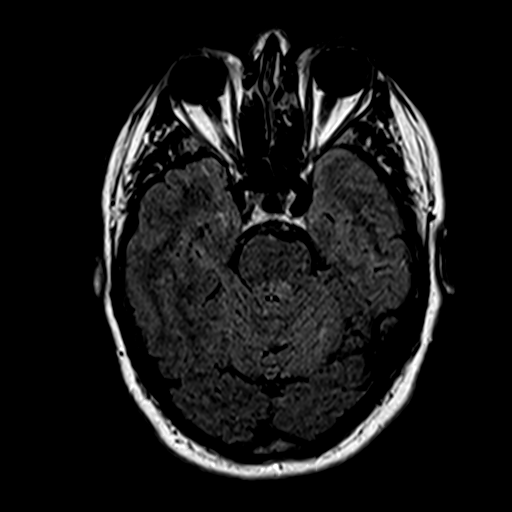
[im 23/45]
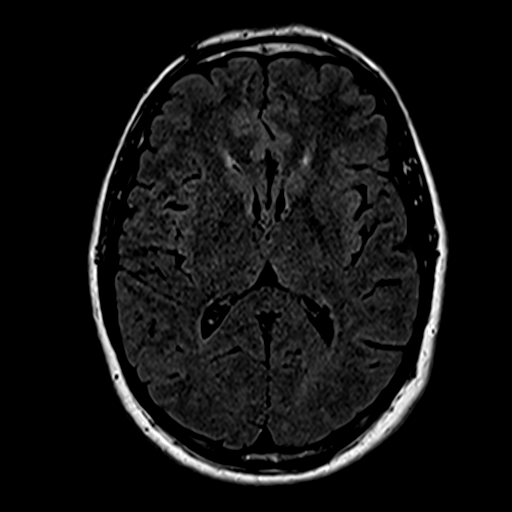
[im 34/45]
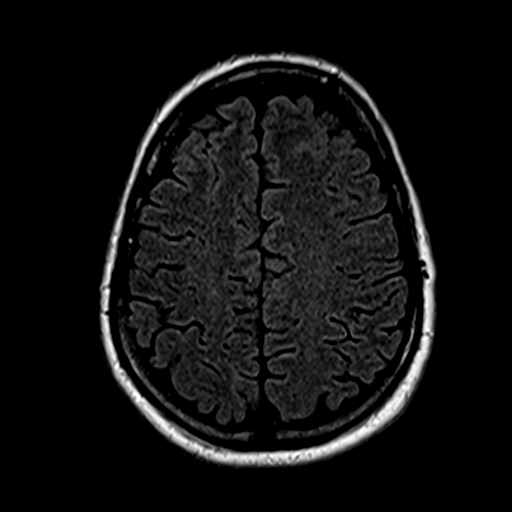
[im 45/45]
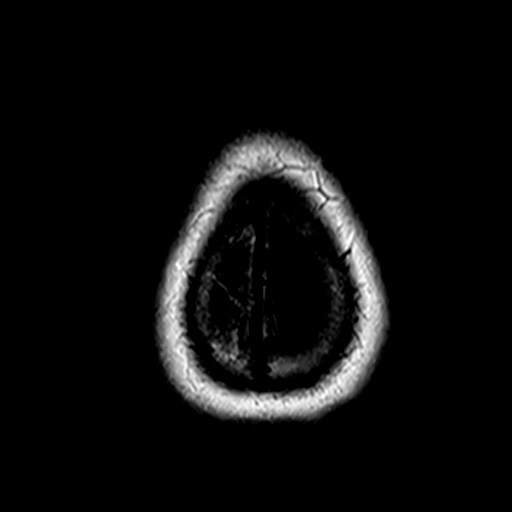

[Series 10: t2_tse_tra_512 · axial · 5.0mm · 0.60mm/px · z∈[-52,+84]mm · 3 of 24 slices shown]
[im 1/24]
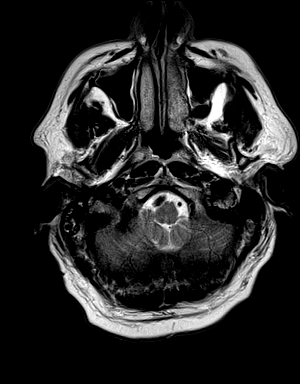
[im 12/24]
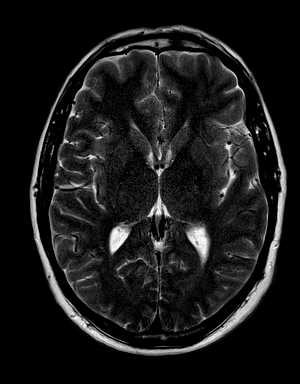
[im 24/24]
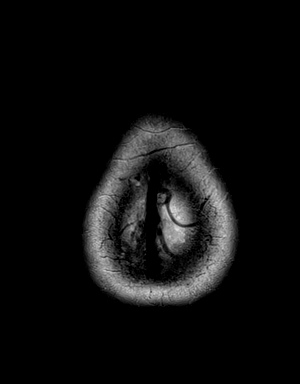

[Series 11: t1_se_axial · axial · 5.0mm · 0.45mm/px · z∈[-52,+84]mm · 3 of 24 slices shown]
[im 1/24]
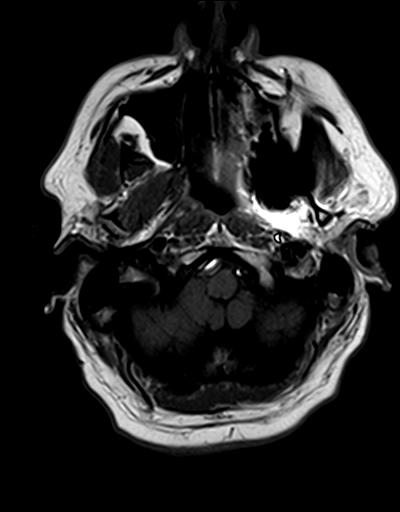
[im 12/24]
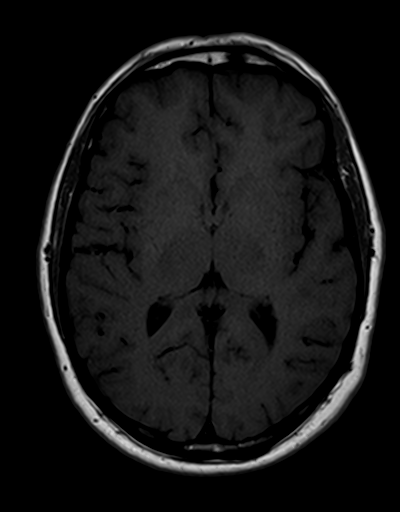
[im 24/24]
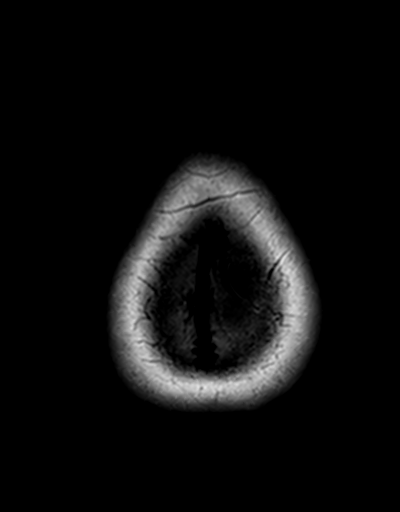

[Series 12: T2 · coronal · 5.0mm · 0.45mm/px · 3 of 27 slices shown]
[im 1/27]
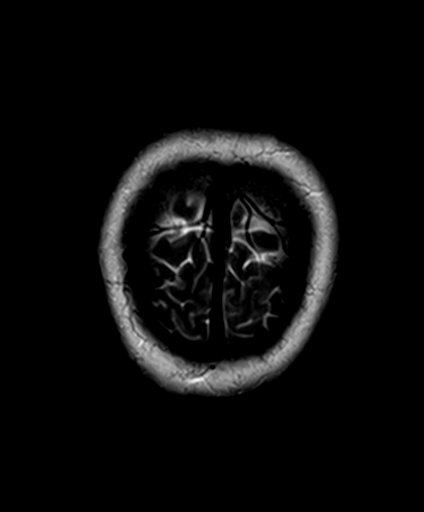
[im 14/27]
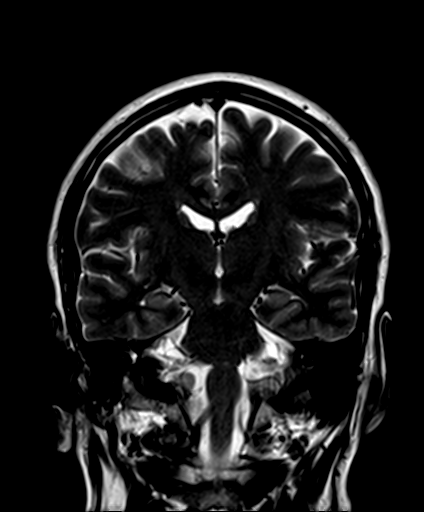
[im 27/27]
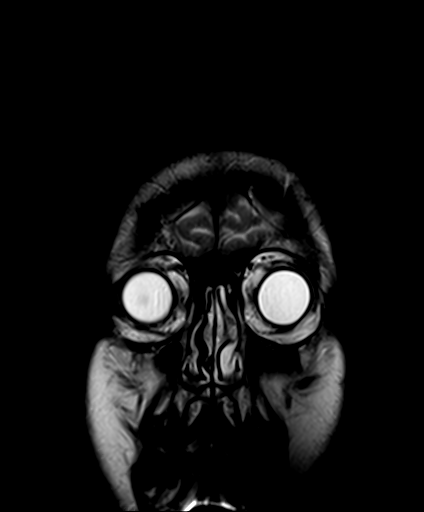

[48 of 48 positions shown; findings below may reference images not displayed]

FINDINGS: MRI HEAD FINDINGS

Brain: No evidence for acute infarction, hemorrhage, mass lesion,
hydrocephalus, or extra-axial fluid. Normal cerebral volume. No
significant white matter disease.

Vascular: Normal flow voids.

Skull and upper cervical spine: Normal marrow signal. Partial empty
sella.

Sinuses/Orbits: Negative.

Other: None.

MRI CERVICAL SPINE FINDINGS

Alignment: Anatomic

Vertebrae: C3 and C4 are poorly visualized. The remainder of the
cervical vertebrae appear unremarkable.

Cord: Mild cord flattening at C5-6.  No abnormal cord signal.

Posterior Fossa, vertebral arteries, paraspinal tissues: Negative.

Disc levels:

C2-3:  Normal.

C3-4:  Not visualized sagittal, but appears normal in axial imaging.

C4-5:  Annular bulge.  No impingement.

C5-6: Disc space narrowing. Annular bulge with osseous spurring.
Posterior element hypertrophy with ligamentum flavum infolding.
Uncinate hypertrophy contributes to BILATERAL C6 foraminal
narrowing. Mild cord flattening without abnormal cord signal. Canal
diameter 6-7 mm.

C6-7:  Unremarkable.

C7-T1:  Unremarkable.
IMPRESSION: Unremarkable MRI brain. No acute or focal intracranial
abnormalities. Partial empty sella, likely incidental.

Cervical spondylosis at C5-6. Disc space narrowing, bony overgrowth,
annular bulging, and posterior element hypertrophy contribute to
stenosis and BILATERAL C6 foraminal narrowing.

Partial obscuration of the mid cervical spine by a metallic foreign
body in the patient's chin. If further investigation desired prior
to surgical intervention in the cervical spine, CT myelography could
be performed without difficulty.
